# Patient Record
Sex: Male | Born: 1970 | Race: White | Hispanic: No | Marital: Married | Smoking: Light tobacco smoker
Health system: Southern US, Community
[De-identification: ages and names within clinical notes are randomized; demographics above are authoritative.]

---

## 2005-12-30 ENCOUNTER — Ambulatory Visit: Payer: Self-pay | Admitting: Podiatry

## 2007-05-26 IMAGING — NM NM BONE LIMITED
1 series · 10 of 10 positions shown · non-contrast
Comparison: none

REASON FOR EXAM: Tibia/fibula pain
COMMENTS:

[Series 1000: bone statics · 2.40mm/px · 5 acquisitions, 10 frames shown]
[im 1/5]
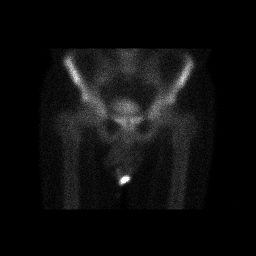
[im 1/5]
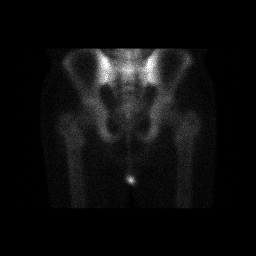
[im 2/5]
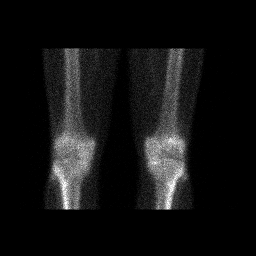
[im 2/5]
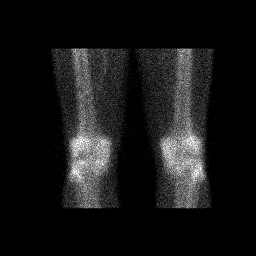
[im 3/5]
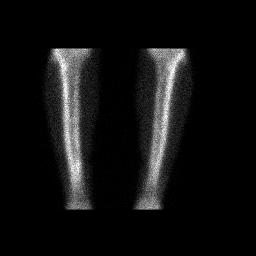
[im 3/5]
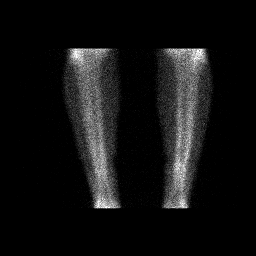
[im 4/5]
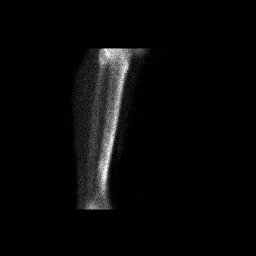
[im 4/5]
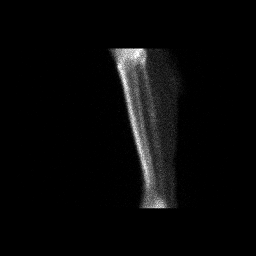
[im 5/5]
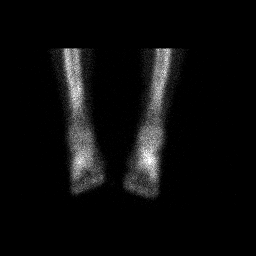
[im 5/5]
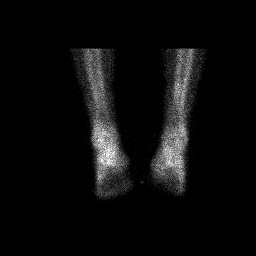

[10 of 10 positions shown; findings below may reference images not displayed]

PROCEDURE:     NM  - NM LIMITED BONE SCAN 3HR [DATE]  [DATE]

RESULT:        Following injection of 21.25 mCi Tc 99m MDP, limited bone
scan of the pelvis, femurs and lower legs was performed.  The patient
reportedly has a knot associated with the RIGHT tibia.  No specific focal
abnormal areas of increased tracer activity are identified in the RIGHT
tibia that would correspond to the clinically reported knot.  There is noted
increased tracer activity involving the anterior tibial cortex bilaterally
and extending from a level just above the ankle superiorly to the knee.
Anterior shin splints or anterior compartment disease would be the primary
considerations as to etiology.
IMPRESSION: No focal areas of increased tracer activity are identified.  There is
observed increased tracer activity in the anterior cortex of both tibias.
This reflects increased bone turnover that may be related to running and
could be associated with anterior shin splints or anterior compartment
disease.

## 2009-01-27 ENCOUNTER — Emergency Department: Payer: Self-pay | Admitting: Emergency Medicine

## 2009-01-30 ENCOUNTER — Ambulatory Visit: Payer: Self-pay | Admitting: Internal Medicine

## 2010-06-23 IMAGING — CR DG FOOT COMPLETE 3+V*L*
1 series · 4 of 4 positions shown · non-contrast
Comparison: none

REASON FOR EXAM: pain, crush injury, laceration--pt in [HOSPITAL]
COMMENTS:

PROCEDURE:     DXR - DXR FOOT LT COMP W/OBLIQUES  - January 27, 2009  [DATE]
RESULT:     Images of the left foot demonstrate no definite fracture or
dislocation. There does appear to be a foreign body over the plantar region
in the midfoot area laterally. Correlate clinically.

[Series 1: view not recorded · 0.17mm/px · 4 of 4 slices shown]
[im 1/4]
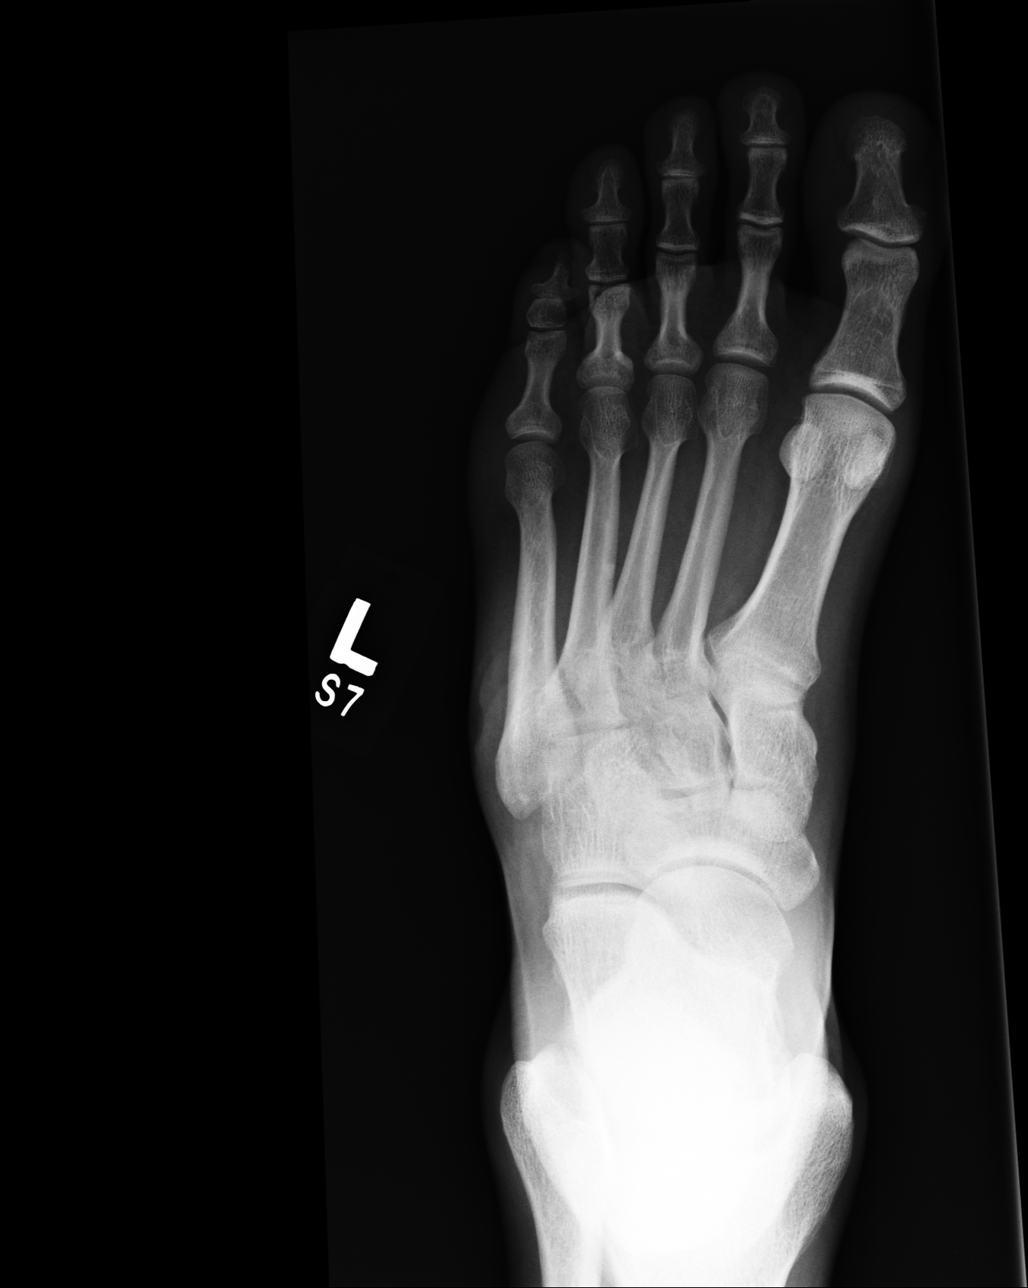
[im 2/4]
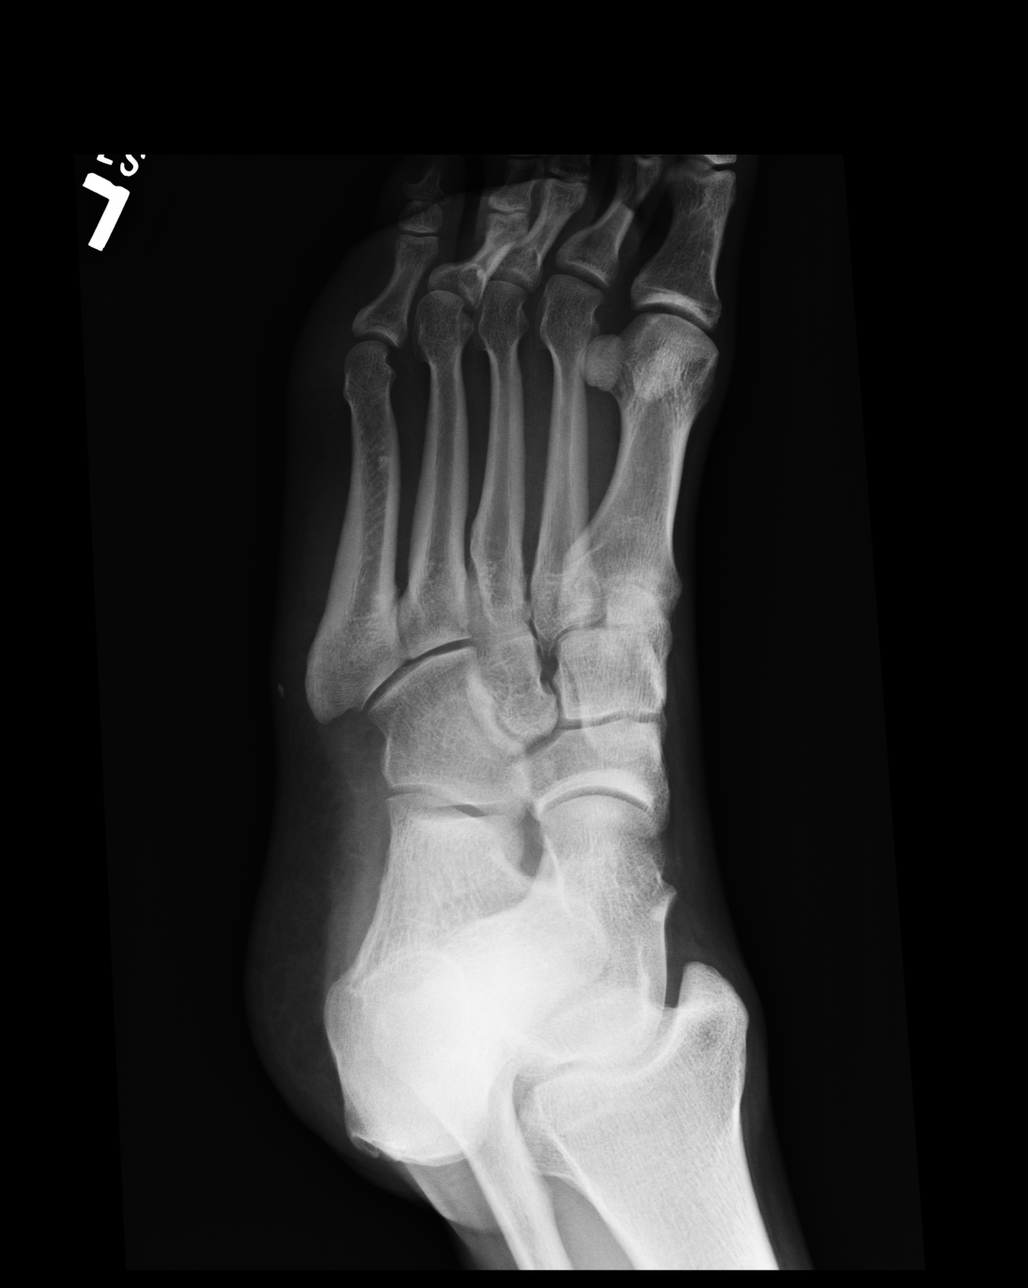
[im 3/4]
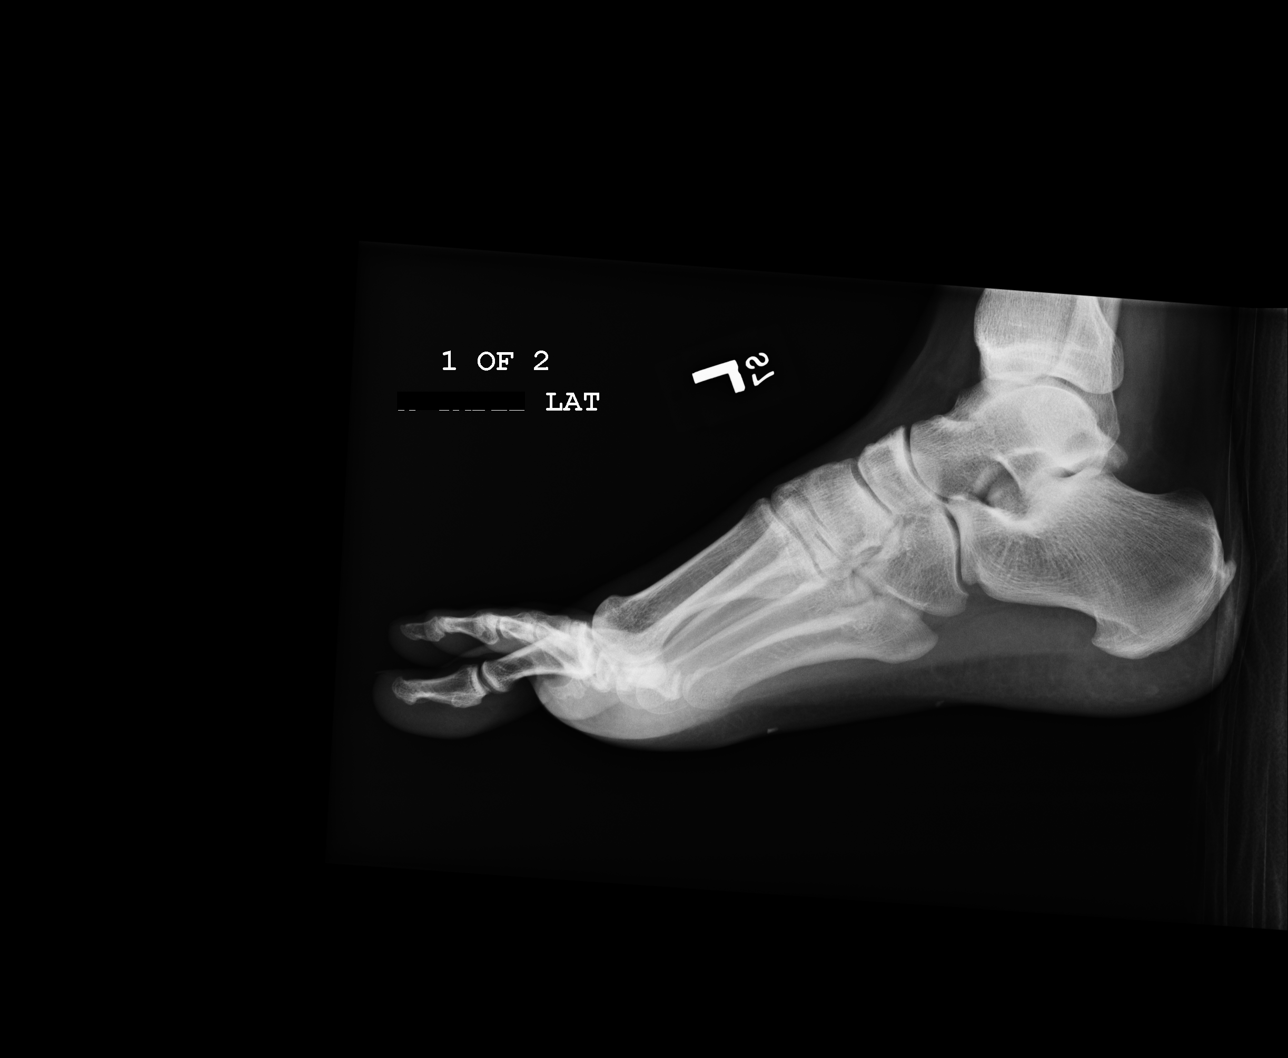
[im 4/4]
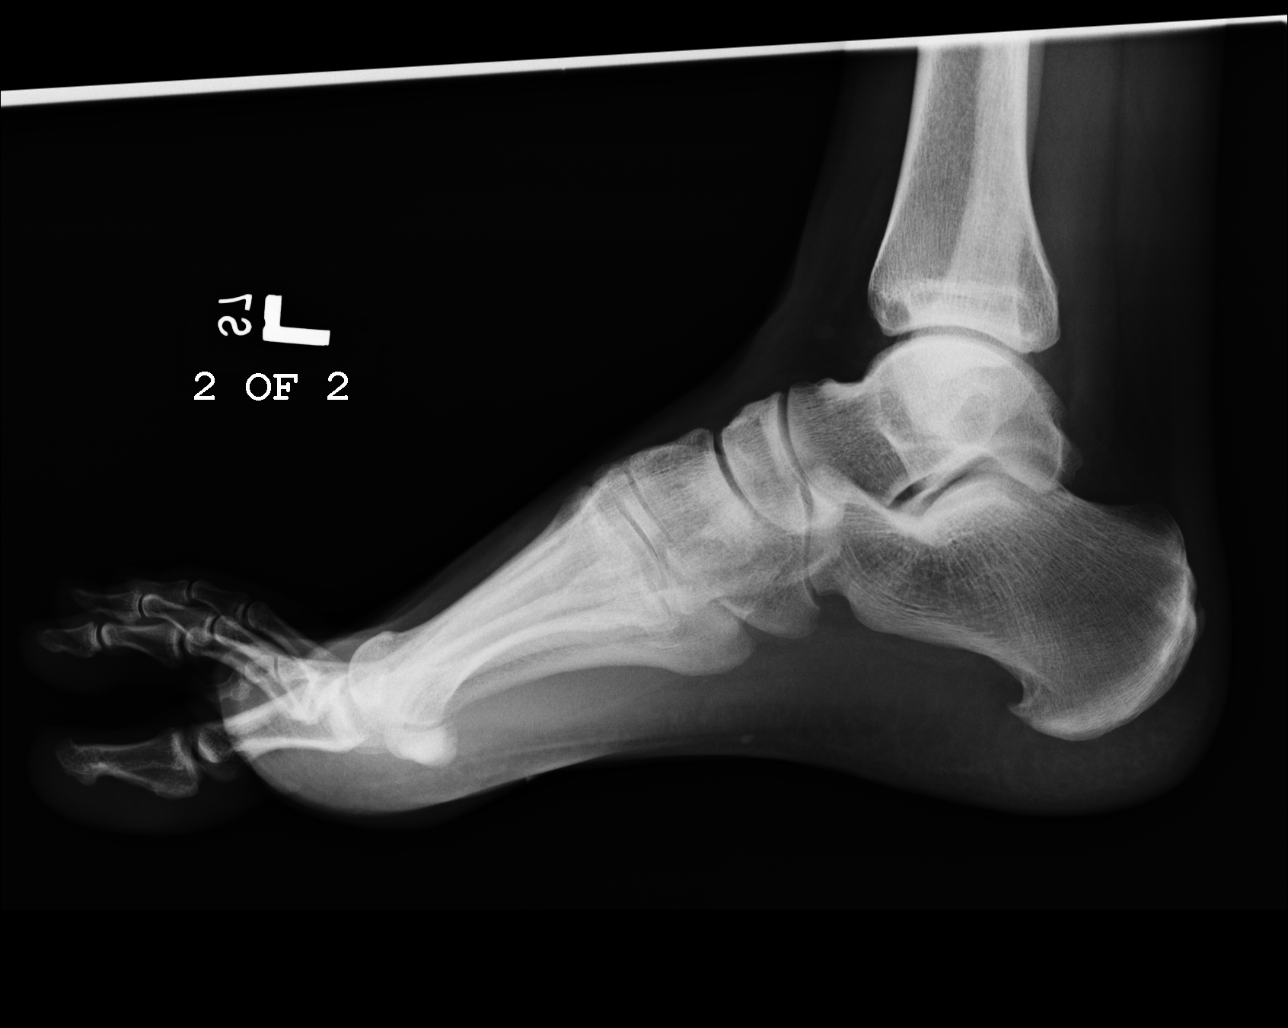

[4 of 4 positions shown; findings below may reference images not displayed]

IMPRESSION: Please see above.

## 2011-04-08 ENCOUNTER — Ambulatory Visit: Payer: Self-pay | Admitting: Internal Medicine

## 2011-04-17 ENCOUNTER — Ambulatory Visit: Payer: Self-pay

## 2011-12-11 ENCOUNTER — Emergency Department: Payer: Self-pay | Admitting: Emergency Medicine

## 2011-12-11 LAB — BASIC METABOLIC PANEL
BUN: 21 mg/dL — ABNORMAL HIGH (ref 7–18)
Co2: 25 mmol/L (ref 21–32)
Creatinine: 0.92 mg/dL (ref 0.60–1.30)
EGFR (African American): >60
EGFR (Non-African Amer.): >60
Sodium: 139 mmol/L (ref 136–145)

## 2011-12-11 LAB — CBC
HCT: 41.9 % (ref 40.0–52.0)
HGB: 14.9 g/dL (ref 13.0–18.0)
MCV: 87 fL (ref 80–100)
RBC: 4.83 10*6/uL (ref 4.40–5.90)

## 2011-12-11 LAB — CK TOTAL AND CKMB (NOT AT ARMC): CK, Total: 166 U/L (ref 35–232)

## 2011-12-12 LAB — CK TOTAL AND CKMB (NOT AT ARMC): CK, Total: 156 U/L (ref 35–232)

## 2011-12-17 ENCOUNTER — Encounter: Payer: Self-pay | Admitting: Cardiovascular Disease

## 2011-12-17 ENCOUNTER — Ambulatory Visit (INDEPENDENT_AMBULATORY_CARE_PROVIDER_SITE_OTHER): Payer: BC Managed Care – PPO | Admitting: Cardiovascular Disease

## 2011-12-17 VITALS — BP 124/88 | HR 57 | Ht 75.0 in | Wt 224.8 lb

## 2011-12-17 VITALS — BP 124/88 | HR 57 | Ht 75.0 in | Wt 224.0 lb

## 2011-12-17 DIAGNOSIS — R079 Chest pain, unspecified: Secondary | ICD-10-CM | POA: Insufficient documentation

## 2011-12-17 NOTE — Progress Notes (Signed)
HPI  This is a 41 year old male who was referred from the emergency room at [facility] for evaluation of chest pain. The patient has no previous cardiac history and no significant chronic medical conditions. He does have family history of coronary artery disease. His mother had CABG in her early 27s. His brother was just diagnosed with hypertension. He does not smoke. He started having chest discomfort about one month ago. It is described as substernal aching sensation which lasts for less than a minute. It happens mostly at rest and does not worsen with physical exercise. He denies any dyspnea. Very rare occasional palpitations but no dizziness, syncope or presyncope. He is physically very active and exercises on a regular basis. He actually ran a marathon last year. Last week, his chest discomfort was worse and radiated to his neck with some left arm numbness. Thus, he went to the emergency room at [facility]. His ECG does not show any acute changes. His labs were unremarkable including negative cardiac enzymes. No Known Allergies   No current outpatient prescriptions on file prior to visit.     History reviewed. No pertinent past medical history.   History reviewed. No pertinent past surgical history.   Family History  Problem Relation Age of Onset  . Heart disease Mother   . Heart attack Mother   . Hypertension Mother   . Hypertension Brother      History   Social History  . Marital Status: Married    Spouse Name: N/A    Number of Children: N/A  . Years of Education: N/A   Occupational History  . Not on file.   Social History Main Topics  . Smoking status: Light Tobacco Smoker    Types: Cigars  . Smokeless tobacco: Not on file  . Alcohol Use: Yes     casual  . Drug Use: No  . Sexually Active:    Other Topics Concern  . Not on file   Social History Narrative  . No narrative on file     ROS Constitutional: Negative for fever, chills, diaphoresis, activity change,  appetite change and fatigue.  HENT: Negative for hearing loss, nosebleeds, congestion, sore throat, facial swelling, drooling, trouble swallowing, neck pain, voice change, sinus pressure and tinnitus.  Eyes: Negative for photophobia, pain, discharge and visual disturbance.  Respiratory: Negative for apnea, cough , shortness of breath and wheezing.  Cardiovascular: Negative for palpitations and leg swelling.  Gastrointestinal: Negative for nausea, vomiting, abdominal pain, diarrhea, constipation, blood in stool and abdominal distention.  Genitourinary: Negative for dysuria, urgency, frequency, hematuria and decreased urine volume.  Musculoskeletal: Negative for myalgias, back pain, joint swelling, arthralgias and gait problem.  Skin: Negative for color change, pallor, rash and wound.  Neurological: Negative for dizziness, tremors, seizures, syncope, speech difficulty, weakness, light-headedness, numbness and headaches.  Psychiatric/Behavioral: Negative for suicidal ideas, hallucinations, behavioral problems and agitation. The patient is not nervous/anxious.     PHYSICAL EXAM   BP 124/88  Pulse 57  Ht 6\' 3"  (1.905 m)  Wt 224 lb 12 oz (101.946 kg)  BMI 28.09 kg/m2  Constitutional: He is oriented to person, place, and time. He appears well-developed and well-nourished. No distress.  HENT: No nasal discharge.  Head: Normocephalic and atraumatic.  Eyes: Pupils are equal and round. Right eye exhibits no discharge. Left eye exhibits no discharge.  Neck: Normal range of motion. Neck supple. No JVD present. No thyromegaly present.  Cardiovascular: Normal rate, regular rhythm, normal heart sounds and. Exam  reveals no gallop and no friction rub. No murmur heard.  Pulmonary/Chest: Effort normal and breath sounds normal. No stridor. No respiratory distress. He has no wheezes. He has no rales. He exhibits no tenderness.  Abdominal: Soft. Bowel sounds are normal. He exhibits no distension. There is no  tenderness. There is no rebound and no guarding.  Musculoskeletal: Normal range of motion. He exhibits no edema and no tenderness.  Neurological: He is alert and oriented to person, place, and time. Coordination normal.  Skin: Skin is warm and dry. No rash noted. He is not diaphoretic. No erythema. No pallor.  Psychiatric: He has a normal mood and affect. His behavior is normal. Judgment and thought content normal.      EKG: Sinus  Bradycardia  -RSR(V1) -nondiagnostic.   PROBABLY NORMAL   ASSESSMENT AND PLAN

## 2011-12-17 NOTE — Assessment & Plan Note (Signed)
His chest pain is overall atypical. Differential diagnoses include musculoskeletal versus GI etiology. Much less likely to be due to ischemic heart disease. He does have family history of CAD. Thus, I recommend a treadmill stress test for evaluation. I discussed with the patient the importance of lifestyle changes in order to decrease the chance of future coronary artery disease and cardiovascular events. We discussed the importance of controlling risk factors, healthy diet as well as regular exercise. I also explained to him that a normal stress test does not rule out atherosclerosis.

## 2011-12-17 NOTE — Patient Instructions (Addendum)
Your physician has requested that you have an exercise tolerance test. For further information please visit [email or web address]. Please also follow instruction sheet, as given.   

## 2011-12-17 NOTE — Procedures (Signed)
    Treadmill Stress test  Indication: Atypical chest pain.  Baseline Data:  Resting EKG shows NSR with rate of 61 bpm, no significant ST or T wave changes. Resting blood pressure of 110/80 mm Hg Stand bruce protocal was used.  Exercise Data:  Patient exercised for 11 min 0 sec,  Peak heart rate of 163 bpm.  This was 92 % of the maximum predicted heart rate. No symptoms of chest pain or lightheadedness were reported at peak stress or in recovery.  Peak Blood pressure recorded was 160/98 Maximal work level: 12.9 METs.  Heart rate at 3 minutes in recovery was 90 bpm. BP response: Normal HR response: Normal  EKG with Exercise: Sinus tachycardia with no significant ST changes. There was 0.5 mm of ST depression in leads 3 and aVF in recovery which is not diagnostic for ischemia.  FINAL IMPRESSION: Normal exercise stress test. No significant EKG changes concerning for ischemia. Excellent exercise tolerance.  Recommendation: I advised the patient to establish with a primary care physician for further workup.

## 2011-12-17 NOTE — Patient Instructions (Addendum)
Your stress test is normal.  Establish with a primary care physician. Followup as needed if symptoms persist without other explanation.

## 2015-09-29 ENCOUNTER — Ambulatory Visit
Admission: EM | Admit: 2015-09-29 | Discharge: 2015-09-29 | Disposition: A | Discharge status: Home / Self Care | Payer: 59 | Attending: Emergency Medicine | Admitting: Emergency Medicine

## 2015-09-29 ENCOUNTER — Encounter: Payer: Self-pay | Admitting: Emergency Medicine

## 2015-09-29 DIAGNOSIS — S81802A Unspecified open wound, left lower leg, initial encounter: Secondary | ICD-10-CM | POA: Diagnosis not present

## 2015-09-29 DIAGNOSIS — Z5189 Encounter for other specified aftercare: Secondary | ICD-10-CM

## 2015-09-29 DIAGNOSIS — S81802D Unspecified open wound, left lower leg, subsequent encounter: Secondary | ICD-10-CM

## 2015-09-29 MED ORDER — CEPHALEXIN 500 MG PO CAPS: 500.0000 mg | ORAL_CAPSULE | Freq: Four times a day (QID) | ORAL | Status: DC

## 2015-09-29 MED ORDER — MUPIROCIN 2 % EX OINT: TOPICAL_OINTMENT | CUTANEOUS | Status: DC

## 2015-09-29 NOTE — ED Notes (Signed)
Patient here for wound check.  Patient [place] in [place].  Patient reports redness around the sutured site in his left lower leg.  Patient denies fever and drainage.

## 2015-09-29 NOTE — Discharge Instructions (Signed)
Take medication as prescribed. Keep clean as discussed. Have sutures removed as previously directed.   Follow up with your primary care physician this week as needed. Return to Urgent care for new or worsening concerns.

## 2015-09-29 NOTE — ED Provider Notes (Signed)
[facility]  ____________________________________________  Time seen: Approximately 9:40 AM  I have reviewed the triage vital signs and the nursing notes.   HISTORY  Chief Complaint Wound Check  HPI Adam Tran is a 45 y.o. male presents with a complaint of wound check to his left leg. Patient reports last Friday he was at the beach and was helping a friend move a Ship brokermirror. Patient states the mirror broke causing a left and right lower extremity laceration. Patient states that he was seen in an urgent care there and had it repaired. Patient states that he has been taking oral cephalexin and doing well. Patient states that the    bottom wound seemed slightly red prompting him to come in today for evaluation. Patient states that he these next week to go on a cruise and wanted to make sure that everything was okay. Denies pain. Denies drainage, fevers, swelling or other complaints. Reports has remained active and doing well. Denies any other injury. Reports tetanus immunization in last 5 years.   Patient reports that he has been taking the oral cephalexin that he was given and states that that prescription runs out tomorrow.  History reviewed. No pertinent past medical history.  Patient Active Problem List   Diagnosis Date Noted  . Chest pain 12/17/2011    History reviewed. No pertinent past surgical history.  Current Outpatient Rx  Name  Route  Sig  Dispense  Refill  . cephALEXin (KEFLEX) 500 MG capsule   Oral   Take 500 mg by mouth 4 (four) times daily.         .           .           .             Allergies Review of patient's allergies indicates no known allergies.  Family History  Problem Relation Age of Onset  . Heart disease Mother   . Heart attack Mother   . Hypertension Mother   . Hypertension Brother     Social History Social History  Substance Use Topics  . Smoking status: Light Tobacco Smoker    Types: Cigars  . Smokeless tobacco: None  .  Alcohol Use: Yes     Comment: casual    Review of Systems Constitutional: No fever/chills Eyes: No visual changes. ENT: No sore throat. Cardiovascular: Denies chest pain. Respiratory: Denies shortness of breath. Gastrointestinal: No abdominal pain.  No nausea, no vomiting.  No diarrhea.  No constipation. Genitourinary: Negative for dysuria. Musculoskeletal: Negative for back pain. Skin: Negative for rash. As above. Neurological: Negative for headaches, focal weakness or numbness.  10-point ROS otherwise negative.  ____________________________________________   PHYSICAL EXAM:  VITAL SIGNS: ED Triage Vitals  Enc Vitals Group     BP 09/29/15 0903 137/80 mmHg     Pulse Rate 09/29/15 0903 61     Resp 09/29/15 0903 16     Temp 09/29/15 0903 97.5 F (36.4 C)     Temp Source 09/29/15 0903 Oral     SpO2 09/29/15 0903 100 %     Weight 09/29/15 0903 220 lb (99.791 kg)     Height 09/29/15 0903 6\' 3"  (1.905 m)     Head Cir --      Peak Flow --      Pain Score 09/29/15 0906 0     Pain Loc --      Pain Edu? --      Excl.  in GC? --     Constitutional: Alert and oriented. Well appearing and in no acute distress. Eyes: Conjunctivae are normal. PERRL. EOMI. Head: Atraumatic.  Mouth/Throat: Mucous membranes are moist.   Neck: No stridor.  No cervical spine tenderness to palpation. Cardiovascular: Normal rate, regular rhythm. Grossly normal heart sounds.  Good peripheral circulation. Respiratory: Normal respiratory effort.  No retractions. Lungs CTAB.No wheezes, rales or rhonchi. Musculoskeletal:  Ambulatory with steady gait. Bilateral pedal pulses equal and easily palpated. Neurologic:  Normal speech and language. No gross focal neurologic deficits are appreciated. No gait instability. Skin:  Skin is warm, dry and intact. No rash noted.  Except: Right anterior tibial area laceration healing well with 3 sutures, no erythema, no drainage, no exudate, no signs of bacterial skin  infection. Except: Left lower extremity 2 lacerations present with sutures present. Upper laceration with 7 sutures and lower with 14 sutures, does appear to be healing well, mild surrounding erythema directly around the wound edges, no other surrounding erythema, no fluctuance, no drainage, no swelling, no induration, nontender. Skin avulsion between the 2 lacerations present with mild erythema surrounding around wound margin, no other erythema, no fluctuance or drainage. Lower left leg with skin avulsion with mild surrounding erythema and erythema at wound margins without exudate, no swelling, no drainage, nontender. Psychiatric: Mood and affect are normal. Speech and behavior are normal.  ____________________________________________   LABS (all labs ordered are listed, but only abnormal results are displayed)  Labs Reviewed - No data to display   INITIAL IMPRESSION / ASSESSMENT AND PLAN / ED COURSE  Pertinent labs & imaging results that were available during my care of the patient were reviewed by me and considered in my medical decision making (see chart for details).  Very well-appearing patient. No acute distress. Presenting for wound evaluation. Patient reports he had bilateral lower actually laceration sustained from a broken mirror last week out of town. Patient states he is going out of town next week and wanted this to be evaluated prior to being left. Patient has been taking oral cephalexin and doing well. As patient's left lower skin avulsion site does have mild erythema will plan to further extend patient cephalexin for 1 week as well as give patient prescription for topical Bactroban. Directed in detail regarding cleaning. Patient reports that he has been using peroxide, directed to stop using peroxide and just use soap and water, in discussing further cleaning. Discussed continued monitoring. Patient states that he was directed to have sutures removed at the end of next week while  on the cruise and is still planning to do this. Directed to elevate, rest, keep clean and dry and follow-up as needed. Discussed indication, risks and benefits of medications with patient.  Discussed follow up with Primary care physician this week. Discussed follow up and return parameters including no resolution or any worsening concerns. Patient verbalized understanding and agreed to plan.     ____________________________________________   FINAL CLINICAL IMPRESSION(S) / ED DIAGNOSES  Final diagnoses:  Leg wound, left, subsequent encounter  Visit for wound check     Discharge Medication List as of 09/29/2015  9:28 AM    START taking these medications   Details  cephALEXin (KEFLEX) 500 MG capsule Take 1 capsule (500 mg total) by mouth 4 (four) times daily., Starting 09/29/2015, Until Discontinued, Normal    mupirocin ointment (BACTROBAN) 2 % Apply three times a day for 7 days., Normal          Note: This  dictation was prepared with Dragon dictation along with smaller phrase technology. Any transcriptional errors that result from this process are unintentional.       Renford DillsLindsey Jacinta Penalver, NP 09/29/15 1106  Renford DillsLindsey Merlin Ege, NP 09/29/15 [phone]

## 2016-02-14 ENCOUNTER — Encounter: Payer: Self-pay | Admitting: Family Medicine

## 2016-02-15 ENCOUNTER — Ambulatory Visit (INDEPENDENT_AMBULATORY_CARE_PROVIDER_SITE_OTHER): Payer: Self-pay | Admitting: Family Medicine

## 2016-02-15 VITALS — BP 128/86 | HR 64 | Ht 74.5 in | Wt 230.0 lb

## 2016-02-15 DIAGNOSIS — Z0289 Encounter for other administrative examinations: Secondary | ICD-10-CM

## 2016-02-15 NOTE — Progress Notes (Signed)
   BP 128/86 (BP Location: Left Arm, Patient Position: Sitting, Cuff Size: Large)   Pulse 64   Ht 6' 2.5" (1.892 m)   Wt 230 lb (104.3 kg)   BMI 29.14 kg/m    Subjective:    Patient ID: Adam Tran, male    DOB: 1971-03-11, 45 y.o.   MRN: [id number]  HPI: Adam Tran is a 45 y.o. male  Chief Complaint  Patient presents with  . 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 Interim medical history since our last visit reviewed. Allergies and medications reviewed and updated.  Review of Systems  Constitutional: Negative.   HENT: Negative.   Eyes: Negative.   Respiratory: Negative.   Cardiovascular: Negative.   Gastrointestinal: Negative.   Endocrine: Negative.   Genitourinary: Negative.   Musculoskeletal: Negative.   Skin: Negative.   Allergic/Immunologic: Negative.   Neurological: Negative.   Hematological: Negative.   Psychiatric/Behavioral: Negative.     Per HPI unless specifically indicated above     Objective:    BP 128/86 (BP Location: Left Arm, Patient Position: Sitting, Cuff Size: Large)   Pulse 64   Ht 6' 2.5" (1.892 m)   Wt 230 lb (104.3 kg)   BMI 29.14 kg/m   Wt Readings from Last 3 Encounters:  02/15/16 230 lb (104.3 kg)  09/29/15 220 lb (99.8 kg)  12/17/11 224 lb (101.6 kg)    Physical Exam  Constitutional: He is oriented to person, place, and time. He appears well-developed and well-nourished.  HENT:  Head: Normocephalic.  Right Ear: External ear normal.  Left Ear: External ear normal.  Nose: Nose normal.  Eyes: Conjunctivae and EOM are normal. Pupils are equal, round, and reactive to light.  Neck: Normal range of motion. Neck supple. No thyromegaly present.  Cardiovascular: Normal rate, regular rhythm, normal heart sounds and intact distal pulses.   Pulmonary/Chest: Effort normal and breath sounds normal.  Abdominal: Soft. Bowel sounds are normal. There is no  splenomegaly or hepatomegaly.  Genitourinary: Penis normal.  Musculoskeletal: Normal range of motion.  Lymphadenopathy:    He has no cervical adenopathy.  Neurological: He is alert and oriented to person, place, and time. He has normal reflexes.  Skin: Skin is warm and dry.  Psychiatric: He has a normal mood and affect. His behavior is normal. Judgment and thought content normal.        Assessment & Plan:   Problem List Items Addressed This Visit    None    Visit Diagnoses    History and physical examination, occupation    -  Primary       Follow up plan: Return if symptoms worsen or fail to improve.

## 2020-12-25 ENCOUNTER — Other Ambulatory Visit: Payer: Self-pay | Admitting: Internal Medicine

## 2020-12-25 ENCOUNTER — Encounter: Payer: Self-pay | Admitting: Internal Medicine

## 2020-12-25 ENCOUNTER — Ambulatory Visit: Payer: 59 | Admitting: Internal Medicine

## 2020-12-25 DIAGNOSIS — J302 Other seasonal allergic rhinitis: Secondary | ICD-10-CM | POA: Insufficient documentation
